# Patient Record
Sex: Female | Born: 2000 | Hispanic: Yes | Marital: Single | Smoking: Current some day smoker
Health system: Southern US, Community
[De-identification: ages and names within clinical notes are randomized; demographics above are authoritative.]

## PROBLEM LIST (undated history)

## (undated) DIAGNOSIS — R51 Headache: Secondary | ICD-10-CM

## (undated) HISTORY — DX: Headache: R51

---
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
## 2018-12-25 ENCOUNTER — Observation Stay: Payer: Managed Care, Other (non HMO) | Admitting: Anesthesiology

## 2018-12-25 ENCOUNTER — Encounter: Payer: Self-pay | Admitting: Emergency Medicine

## 2018-12-25 ENCOUNTER — Encounter
Admission: EM | Disposition: A | Discharge status: Home / Self Care | Payer: Self-pay | Source: Home / Self Care | Attending: Emergency Medicine

## 2018-12-25 ENCOUNTER — Emergency Department: Payer: Managed Care, Other (non HMO)

## 2018-12-25 ENCOUNTER — Observation Stay
Admission: EM | Admit: 2018-12-25 | Discharge: 2018-12-26 | Disposition: A | Discharge status: Home / Self Care | Payer: Managed Care, Other (non HMO) | Attending: Surgery | Admitting: Surgery

## 2018-12-25 ENCOUNTER — Observation Stay: Payer: Managed Care, Other (non HMO)

## 2018-12-25 ENCOUNTER — Other Ambulatory Visit: Payer: Self-pay

## 2018-12-25 DIAGNOSIS — F17200 Nicotine dependence, unspecified, uncomplicated: Secondary | ICD-10-CM | POA: Diagnosis not present

## 2018-12-25 DIAGNOSIS — Z793 Long term (current) use of hormonal contraceptives: Secondary | ICD-10-CM | POA: Insufficient documentation

## 2018-12-25 DIAGNOSIS — Z419 Encounter for procedure for purposes other than remedying health state, unspecified: Secondary | ICD-10-CM

## 2018-12-25 DIAGNOSIS — S82842A Displaced bimalleolar fracture of left lower leg, initial encounter for closed fracture: Secondary | ICD-10-CM | POA: Diagnosis not present

## 2018-12-25 DIAGNOSIS — S0101XA Laceration without foreign body of scalp, initial encounter: Secondary | ICD-10-CM | POA: Diagnosis not present

## 2018-12-25 DIAGNOSIS — S82892A Other fracture of left lower leg, initial encounter for closed fracture: Secondary | ICD-10-CM | POA: Diagnosis present

## 2018-12-25 DIAGNOSIS — S0003XA Contusion of scalp, initial encounter: Secondary | ICD-10-CM

## 2018-12-25 DIAGNOSIS — W010XXA Fall on same level from slipping, tripping and stumbling without subsequent striking against object, initial encounter: Secondary | ICD-10-CM | POA: Diagnosis not present

## 2018-12-25 DIAGNOSIS — M25572 Pain in left ankle and joints of left foot: Secondary | ICD-10-CM | POA: Diagnosis present

## 2018-12-25 HISTORY — PX: ORIF ANKLE FRACTURE: SHX5408

## 2018-12-25 LAB — CBC WITH DIFFERENTIAL/PLATELET
Abs Immature Granulocytes: 0.02 10*3/uL (ref 0.00–0.07)
Basophils Absolute: 0 10*3/uL (ref 0.0–0.1)
Basophils Relative: 0 %
Eosinophils Absolute: 0.1 10*3/uL (ref 0.0–0.5)
Eosinophils Relative: 1 %
HCT: 36.7 % (ref 36.0–46.0)
Hemoglobin: 12.7 g/dL (ref 12.0–15.0)
Immature Granulocytes: 0 %
Lymphocytes Relative: 19 %
Lymphs Abs: 1.6 10*3/uL (ref 0.7–4.0)
MCH: 31.3 pg (ref 26.0–34.0)
MCHC: 34.6 g/dL (ref 30.0–36.0)
MCV: 90.4 fL (ref 80.0–100.0)
Monocytes Absolute: 0.4 10*3/uL (ref 0.1–1.0)
Monocytes Relative: 5 %
Neutro Abs: 6.2 10*3/uL (ref 1.7–7.7)
Neutrophils Relative %: 75 %
Platelets: 198 10*3/uL (ref 150–400)
RBC: 4.06 MIL/uL (ref 3.87–5.11)
RDW: 11.8 % (ref 11.5–15.5)
WBC: 8.3 10*3/uL (ref 4.0–10.5)
nRBC: 0 % (ref 0.0–0.2)

## 2018-12-25 LAB — BASIC METABOLIC PANEL
Anion gap: 11 (ref 5–15)
BUN: 9 mg/dL (ref 6–20)
CO2: 22 mmol/L (ref 22–32)
Calcium: 8.7 mg/dL — ABNORMAL LOW (ref 8.9–10.3)
Chloride: 101 mmol/L (ref 98–111)
Creatinine, Ser: 0.47 mg/dL (ref 0.44–1.00)
GFR calc Af Amer: >60 mL/min (ref >60)
GFR calc non Af Amer: >60 mL/min (ref >60)
Glucose, Bld: 112 mg/dL — ABNORMAL HIGH (ref 70–99)
Potassium: 2.8 mmol/L — ABNORMAL LOW (ref 3.5–5.1)
Sodium: 134 mmol/L — ABNORMAL LOW (ref 135–145)

## 2018-12-25 LAB — URINE DRUG SCREEN, QUALITATIVE (ARMC ONLY)
Amphetamines, Ur Screen: NOT DETECTED
Barbiturates, Ur Screen: NOT DETECTED
Benzodiazepine, Ur Scrn: NOT DETECTED
Cannabinoid 50 Ng, Ur ~~LOC~~: NOT DETECTED
Cocaine Metabolite,Ur ~~LOC~~: NOT DETECTED
MDMA (Ecstasy)Ur Screen: NOT DETECTED
Methadone Scn, Ur: NOT DETECTED
Opiate, Ur Screen: POSITIVE — AB
Phencyclidine (PCP) Ur S: NOT DETECTED
Tricyclic, Ur Screen: NOT DETECTED

## 2018-12-25 LAB — ETHANOL: Alcohol, Ethyl (B): 79 mg/dL — ABNORMAL HIGH (ref <10)

## 2018-12-25 LAB — POCT I-STAT 4, (NA,K, GLUC, HGB,HCT)
Glucose, Bld: 112 mg/dL — ABNORMAL HIGH (ref 70–99)
HCT: 31 % — ABNORMAL LOW (ref 36.0–46.0)
Hemoglobin: 10.5 g/dL — ABNORMAL LOW (ref 12.0–15.0)
Potassium: 3.7 mmol/L (ref 3.5–5.1)
Sodium: 138 mmol/L (ref 135–145)

## 2018-12-25 LAB — PROTIME-INR
INR: 1 (ref 0.8–1.2)
Prothrombin Time: 13.3 seconds (ref 11.4–15.2)

## 2018-12-25 LAB — PREGNANCY, URINE: Preg Test, Ur: NEGATIVE

## 2018-12-25 SURGERY — OPEN REDUCTION INTERNAL FIXATION (ORIF) ANKLE FRACTURE
Anesthesia: General | Site: Ankle | Laterality: Left

## 2018-12-25 MED ORDER — POTASSIUM CHLORIDE IN NACL 20-0.9 MEQ/L-% IV SOLN
INTRAVENOUS | Status: DC
  Administered 2018-12-25: 16:00:00 via INTRAVENOUS
  Filled 2018-12-25 (×3): qty 1000

## 2018-12-25 MED ORDER — DOCUSATE SODIUM 100 MG PO CAPS: 100.0000 mg | ORAL_CAPSULE | Freq: Two times a day (BID) | ORAL | Status: DC

## 2018-12-25 MED ORDER — DEXAMETHASONE SODIUM PHOSPHATE 10 MG/ML IJ SOLN
INTRAMUSCULAR | Status: DC | PRN
  Administered 2018-12-25: 10 mg via INTRAVENOUS

## 2018-12-25 MED ORDER — DIPHENHYDRAMINE HCL 12.5 MG/5ML PO ELIX
12.5000 mg | ORAL_SOLUTION | ORAL | Status: DC | PRN
  Filled 2018-12-25: qty 10

## 2018-12-25 MED ORDER — ONDANSETRON HCL 4 MG/2ML IJ SOLN
INTRAMUSCULAR | Status: AC
  Filled 2018-12-25: qty 2

## 2018-12-25 MED ORDER — OXYCODONE HCL 5 MG PO TABS: 5.0000 mg | ORAL_TABLET | ORAL | Status: DC | PRN

## 2018-12-25 MED ORDER — ONDANSETRON HCL 4 MG/2ML IJ SOLN
4.0000 mg | INTRAMUSCULAR | Status: AC
  Administered 2018-12-25: 4 mg via INTRAVENOUS
  Filled 2018-12-25: qty 2

## 2018-12-25 MED ORDER — FENTANYL CITRATE (PF) 100 MCG/2ML IJ SOLN: 25.0000 ug | INTRAMUSCULAR | Status: DC | PRN

## 2018-12-25 MED ORDER — ACETAMINOPHEN 325 MG PO TABS: 325.0000 mg | ORAL_TABLET | Freq: Four times a day (QID) | ORAL | Status: DC | PRN

## 2018-12-25 MED ORDER — BISACODYL 10 MG RE SUPP: 10.0000 mg | Freq: Every day | RECTAL | Status: DC | PRN

## 2018-12-25 MED ORDER — ONDANSETRON HCL 4 MG/2ML IJ SOLN: 4.0000 mg | Freq: Four times a day (QID) | INTRAMUSCULAR | Status: DC | PRN

## 2018-12-25 MED ORDER — LIDOCAINE-EPINEPHRINE 2 %-1:100000 IJ SOLN: 30.0000 mL | Freq: Once | INTRAMUSCULAR | Status: DC

## 2018-12-25 MED ORDER — GLYCOPYRROLATE 0.2 MG/ML IJ SOLN
INTRAMUSCULAR | Status: DC | PRN
  Administered 2018-12-25: 0.2 mg via INTRAVENOUS

## 2018-12-25 MED ORDER — OXYCODONE HCL 5 MG PO TABS: 5.0000 mg | ORAL_TABLET | Freq: Four times a day (QID) | ORAL | 0 refills | Status: AC | PRN

## 2018-12-25 MED ORDER — HYDROMORPHONE HCL 1 MG/ML IJ SOLN: 0.2500 mg | INTRAMUSCULAR | Status: DC | PRN

## 2018-12-25 MED ORDER — METOCLOPRAMIDE HCL 5 MG PO TABS: 5.0000 mg | ORAL_TABLET | Freq: Three times a day (TID) | ORAL | Status: DC | PRN

## 2018-12-25 MED ORDER — MORPHINE SULFATE (PF) 2 MG/ML IV SOLN
2.0000 mg | Freq: Once | INTRAVENOUS | Status: AC
  Administered 2018-12-25: 04:00:00 2 mg via INTRAVENOUS
  Filled 2018-12-25: qty 1

## 2018-12-25 MED ORDER — FLEET ENEMA 7-19 GM/118ML RE ENEM: 1.0000 | ENEMA | Freq: Once | RECTAL | Status: DC | PRN

## 2018-12-25 MED ORDER — KETOROLAC TROMETHAMINE 15 MG/ML IJ SOLN
15.0000 mg | Freq: Four times a day (QID) | INTRAMUSCULAR | Status: DC
  Administered 2018-12-25: 15 mg via INTRAVENOUS
  Filled 2018-12-25 (×4): qty 1

## 2018-12-25 MED ORDER — 0.9 % SODIUM CHLORIDE (POUR BTL) OPTIME
TOPICAL | Status: DC | PRN
  Administered 2018-12-25: 13:00:00 1000 mL

## 2018-12-25 MED ORDER — LACTATED RINGERS IV SOLN
INTRAVENOUS | Status: DC | PRN
  Administered 2018-12-25: 12:00:00 via INTRAVENOUS

## 2018-12-25 MED ORDER — LIDOCAINE-EPINEPHRINE 2 %-1:100000 IJ SOLN
20.0000 mL | Freq: Once | INTRAMUSCULAR | Status: AC
  Administered 2018-12-25: 5 mL via INTRADERMAL
  Filled 2018-12-25: qty 1

## 2018-12-25 MED ORDER — KETOROLAC TROMETHAMINE 30 MG/ML IJ SOLN
INTRAMUSCULAR | Status: AC
  Administered 2018-12-25: 14:00:00
  Filled 2018-12-25: qty 1

## 2018-12-25 MED ORDER — MUPIROCIN 2 % EX OINT
1.0000 "application " | TOPICAL_OINTMENT | Freq: Two times a day (BID) | CUTANEOUS | Status: DC
  Administered 2018-12-25 (×2): 1 via NASAL
  Filled 2018-12-25: qty 22

## 2018-12-25 MED ORDER — CEFAZOLIN SODIUM-DEXTROSE 2-3 GM-%(50ML) IV SOLR
INTRAVENOUS | Status: DC | PRN
  Administered 2018-12-25: 2 g via INTRAVENOUS

## 2018-12-25 MED ORDER — KETOROLAC TROMETHAMINE 30 MG/ML IJ SOLN
30.0000 mg | Freq: Once | INTRAMUSCULAR | Status: AC
  Administered 2018-12-25: 14:00:00 30 mg via INTRAVENOUS

## 2018-12-25 MED ORDER — SUCCINYLCHOLINE CHLORIDE 20 MG/ML IJ SOLN
INTRAMUSCULAR | Status: AC
  Filled 2018-12-25: qty 1

## 2018-12-25 MED ORDER — ONDANSETRON HCL 4 MG/2ML IJ SOLN
INTRAMUSCULAR | Status: DC | PRN
  Administered 2018-12-25: 4 mg via INTRAVENOUS

## 2018-12-25 MED ORDER — OXYCODONE HCL 5 MG/5ML PO SOLN: 5.0000 mg | Freq: Once | ORAL | Status: DC | PRN

## 2018-12-25 MED ORDER — KCL IN DEXTROSE-NACL 20-5-0.9 MEQ/L-%-% IV SOLN
INTRAVENOUS | Status: DC
  Administered 2018-12-25: 06:00:00 via INTRAVENOUS
  Filled 2018-12-25 (×3): qty 1000

## 2018-12-25 MED ORDER — ACETAMINOPHEN 500 MG PO TABS
1000.0000 mg | ORAL_TABLET | Freq: Four times a day (QID) | ORAL | Status: DC
  Administered 2018-12-25: 18:00:00 1000 mg via ORAL
  Filled 2018-12-25: qty 2

## 2018-12-25 MED ORDER — DOCUSATE SODIUM 100 MG PO CAPS
100.0000 mg | ORAL_CAPSULE | Freq: Two times a day (BID) | ORAL | Status: DC
  Administered 2018-12-25: 22:00:00 100 mg via ORAL
  Filled 2018-12-25: qty 1

## 2018-12-25 MED ORDER — ACETAMINOPHEN 650 MG RE SUPP: 650.0000 mg | Freq: Four times a day (QID) | RECTAL | Status: DC | PRN

## 2018-12-25 MED ORDER — CEFAZOLIN SODIUM-DEXTROSE 2-4 GM/100ML-% IV SOLN
2.0000 g | Freq: Once | INTRAVENOUS | Status: DC
  Filled 2018-12-25: qty 100

## 2018-12-25 MED ORDER — ACETAMINOPHEN 325 MG PO TABS: 650.0000 mg | ORAL_TABLET | Freq: Four times a day (QID) | ORAL | Status: DC | PRN

## 2018-12-25 MED ORDER — POTASSIUM CHLORIDE CRYS ER 20 MEQ PO TBCR: 40.0000 meq | EXTENDED_RELEASE_TABLET | Freq: Once | ORAL | Status: DC

## 2018-12-25 MED ORDER — BUPIVACAINE HCL 0.5 % IJ SOLN
INTRAMUSCULAR | Status: DC | PRN
  Administered 2018-12-25: 20 mL

## 2018-12-25 MED ORDER — MIDAZOLAM HCL 2 MG/2ML IJ SOLN
INTRAMUSCULAR | Status: AC
  Filled 2018-12-25: qty 2

## 2018-12-25 MED ORDER — PANTOPRAZOLE SODIUM 40 MG IV SOLR: 40.0000 mg | Freq: Every day | INTRAVENOUS | Status: DC

## 2018-12-25 MED ORDER — CEFAZOLIN SODIUM-DEXTROSE 2-4 GM/100ML-% IV SOLN
2.0000 g | Freq: Four times a day (QID) | INTRAVENOUS | Status: AC
  Administered 2018-12-25 – 2018-12-26 (×3): 2 g via INTRAVENOUS
  Filled 2018-12-25 (×3): qty 100

## 2018-12-25 MED ORDER — ENOXAPARIN SODIUM 40 MG/0.4ML ~~LOC~~ SOLN
40.0000 mg | SUBCUTANEOUS | Status: DC
  Administered 2018-12-26: 08:00:00 40 mg via SUBCUTANEOUS
  Filled 2018-12-25: qty 0.4

## 2018-12-25 MED ORDER — LIDOCAINE HCL (CARDIAC) PF 100 MG/5ML IV SOSY
PREFILLED_SYRINGE | INTRAVENOUS | Status: DC | PRN
  Administered 2018-12-25: 60 mg via INTRAVENOUS

## 2018-12-25 MED ORDER — OXYCODONE HCL 5 MG PO TABS: 5.0000 mg | ORAL_TABLET | Freq: Once | ORAL | Status: DC | PRN

## 2018-12-25 MED ORDER — ACETAMINOPHEN 10 MG/ML IV SOLN
INTRAVENOUS | Status: AC
  Filled 2018-12-25: qty 100

## 2018-12-25 MED ORDER — MAGNESIUM HYDROXIDE 400 MG/5ML PO SUSP
30.0000 mL | Freq: Every day | ORAL | Status: DC | PRN
  Filled 2018-12-25: qty 30

## 2018-12-25 MED ORDER — ONDANSETRON HCL 4 MG PO TABS: 4.0000 mg | ORAL_TABLET | Freq: Four times a day (QID) | ORAL | Status: DC | PRN

## 2018-12-25 MED ORDER — MIDAZOLAM HCL 2 MG/2ML IJ SOLN
INTRAMUSCULAR | Status: DC | PRN
  Administered 2018-12-25: 2 mg via INTRAVENOUS

## 2018-12-25 MED ORDER — FENTANYL CITRATE (PF) 100 MCG/2ML IJ SOLN
INTRAMUSCULAR | Status: AC
  Filled 2018-12-25: qty 4

## 2018-12-25 MED ORDER — FENTANYL CITRATE (PF) 100 MCG/2ML IJ SOLN
INTRAMUSCULAR | Status: DC | PRN
  Administered 2018-12-25: 25 ug via INTRAVENOUS
  Administered 2018-12-25 (×3): 50 ug via INTRAVENOUS
  Administered 2018-12-25: 25 ug via INTRAVENOUS

## 2018-12-25 MED ORDER — TRAMADOL HCL 50 MG PO TABS
50.0000 mg | ORAL_TABLET | Freq: Four times a day (QID) | ORAL | Status: DC | PRN
  Administered 2018-12-25: 50 mg via ORAL
  Filled 2018-12-25: qty 1

## 2018-12-25 MED ORDER — DEXAMETHASONE SODIUM PHOSPHATE 4 MG/ML IJ SOLN
INTRAMUSCULAR | Status: AC
  Filled 2018-12-25: qty 1

## 2018-12-25 MED ORDER — ACETAMINOPHEN 10 MG/ML IV SOLN
INTRAVENOUS | Status: DC | PRN
  Administered 2018-12-25: 1000 mg via INTRAVENOUS

## 2018-12-25 MED ORDER — NORETHIN ACE-ETH ESTRAD-FE 1.5-30 MG-MCG PO TABS: 1.0000 | ORAL_TABLET | Freq: Every day | ORAL | Status: DC

## 2018-12-25 MED ORDER — METOCLOPRAMIDE HCL 5 MG/ML IJ SOLN: 5.0000 mg | Freq: Three times a day (TID) | INTRAMUSCULAR | Status: DC | PRN

## 2018-12-25 MED ORDER — LIDOCAINE HCL (PF) 1 % IJ SOLN: 5.0000 mL | Freq: Once | INTRAMUSCULAR | Status: DC

## 2018-12-25 MED ORDER — GLYCOPYRROLATE 0.2 MG/ML IJ SOLN
INTRAMUSCULAR | Status: AC
  Filled 2018-12-25: qty 1

## 2018-12-25 MED ORDER — LIDOCAINE HCL (PF) 2 % IJ SOLN
INTRAMUSCULAR | Status: AC
  Filled 2018-12-25: qty 10

## 2018-12-25 MED ORDER — PROPOFOL 10 MG/ML IV BOLUS
INTRAVENOUS | Status: DC | PRN
  Administered 2018-12-25: 150 mg via INTRAVENOUS

## 2018-12-25 MED ORDER — PROPOFOL 10 MG/ML IV BOLUS
INTRAVENOUS | Status: AC
  Filled 2018-12-25: qty 20

## 2018-12-25 MED ORDER — SEVOFLURANE IN SOLN
RESPIRATORY_TRACT | Status: AC
  Filled 2018-12-25: qty 250

## 2018-12-25 MED ORDER — BUPIVACAINE HCL (PF) 0.5 % IJ SOLN
INTRAMUSCULAR | Status: AC
  Filled 2018-12-25: qty 30

## 2018-12-25 SURGICAL SUPPLY — 64 items
BANDAGE ACE 4X5 VEL STRL LF (GAUZE/BANDAGES/DRESSINGS) ×3 IMPLANT
BANDAGE ACE 6X5 VEL STRL LF (GAUZE/BANDAGES/DRESSINGS) ×3 IMPLANT
BIT DRILL 2.5X2.75 QC CALB (BIT) ×3 IMPLANT
BIT DRILL 2.9 CANN QC NONSTRL (BIT) ×3 IMPLANT
BIT DRILL CALIBRATED 2.7 (BIT) ×2 IMPLANT
BIT DRILL CALIBRATED 2.7MM (BIT) ×1
BLADE SURG 10 STRL SS SAFETY (BLADE) ×3 IMPLANT
BLADE SURG SZ10 CARB STEEL (BLADE) ×6 IMPLANT
BNDG COHESIVE 4X5 TAN STRL (GAUZE/BANDAGES/DRESSINGS) ×3 IMPLANT
BNDG ESMARK 6X12 TAN STRL LF (GAUZE/BANDAGES/DRESSINGS) ×3 IMPLANT
BNDG PLASTER FAST 4X5 WHT LF (CAST SUPPLIES) ×12 IMPLANT
CANISTER SUCT 1200ML W/VALVE (MISCELLANEOUS) ×3 IMPLANT
CHLORAPREP W/TINT 26 (MISCELLANEOUS) ×6 IMPLANT
COVER WAND RF STERILE (DRAPES) ×3 IMPLANT
CUFF TOURN SGL QUICK 24 (TOURNIQUET CUFF)
CUFF TOURN SGL QUICK 30 (TOURNIQUET CUFF)
CUFF TRNQT CYL 24X4X16.5-23 (TOURNIQUET CUFF) IMPLANT
CUFF TRNQT CYL 30X4X21-28X (TOURNIQUET CUFF) IMPLANT
DRAPE C-ARM XRAY 36X54 (DRAPES) ×3 IMPLANT
DRAPE C-ARMOR (DRAPES) ×3 IMPLANT
DRAPE INCISE IOBAN 66X45 STRL (DRAPES) ×3 IMPLANT
DRAPE U-SHAPE 47X51 STRL (DRAPES) ×3 IMPLANT
ELECT CAUTERY BLADE 6.4 (BLADE) ×3 IMPLANT
ELECT REM PT RETURN 9FT ADLT (ELECTROSURGICAL) ×3
ELECTRODE REM PT RTRN 9FT ADLT (ELECTROSURGICAL) ×1 IMPLANT
GAUZE SPONGE 4X4 12PLY STRL (GAUZE/BANDAGES/DRESSINGS) ×3 IMPLANT
GAUZE XEROFORM 1X8 LF (GAUZE/BANDAGES/DRESSINGS) ×3 IMPLANT
GLOVE BIO SURGEON STRL SZ8 (GLOVE) ×6 IMPLANT
GLOVE INDICATOR 8.0 STRL GRN (GLOVE) ×3 IMPLANT
GOWN STRL REUS W/ TWL LRG LVL3 (GOWN DISPOSABLE) ×1 IMPLANT
GOWN STRL REUS W/ TWL XL LVL3 (GOWN DISPOSABLE) ×1 IMPLANT
GOWN STRL REUS W/TWL LRG LVL3 (GOWN DISPOSABLE) ×2
GOWN STRL REUS W/TWL XL LVL3 (GOWN DISPOSABLE) ×2
HEMOVAC 400ML (MISCELLANEOUS)
K-WIRE ACE 1.6X6 (WIRE) ×9
KIT DRAIN HEMOVAC JP 7FR 400ML (MISCELLANEOUS) IMPLANT
KIT TURNOVER KIT A (KITS) ×3 IMPLANT
KWIRE ACE 1.6X6 (WIRE) ×3 IMPLANT
LABEL OR SOLS (LABEL) ×3 IMPLANT
NS IRRIG 1000ML POUR BTL (IV SOLUTION) ×3 IMPLANT
PACK EXTREMITY ARMC (MISCELLANEOUS) ×3 IMPLANT
PAD ABD DERMACEA PRESS 5X9 (GAUZE/BANDAGES/DRESSINGS) ×3 IMPLANT
PAD CAST CTTN 4X4 STRL (SOFTGOODS) ×1 IMPLANT
PAD PREP 24X41 OB/GYN DISP (PERSONAL CARE ITEMS) ×3 IMPLANT
PADDING CAST COTTON 4X4 STRL (SOFTGOODS) ×2
PLATE LOCK 7H 92 BILAT FIB (Plate) ×3 IMPLANT
SCREW ACE CAN 4.0 42M (Screw) ×3 IMPLANT
SCREW ACE CAN 4.0 50M (Screw) ×3 IMPLANT
SCREW LOCK CORT STAR 3.5X12 (Screw) ×6 IMPLANT
SCREW LOCK CORT STAR 3.5X14 (Screw) ×3 IMPLANT
SCREW LOW PROFILE 12MMX3.5MM (Screw) ×9 IMPLANT
SPLINT CAST 1 STEP 4X30 (MISCELLANEOUS) ×3 IMPLANT
SPONGE LAP 18X18 RF (DISPOSABLE) ×3 IMPLANT
STAPLER SKIN PROX 35W (STAPLE) ×3 IMPLANT
STOCKINETTE IMPERV 14X48 (MISCELLANEOUS) ×3 IMPLANT
STOCKINETTE M/LG 89821 (MISCELLANEOUS) ×3 IMPLANT
SUT VIC AB 0 CT1 36 (SUTURE) ×3 IMPLANT
SUT VIC AB 2-0 CT1 27 (SUTURE) ×6
SUT VIC AB 2-0 CT1 TAPERPNT 27 (SUTURE) ×3 IMPLANT
SUT VIC AB 2-0 SH 27 (SUTURE)
SUT VIC AB 2-0 SH 27XBRD (SUTURE) IMPLANT
SUT VIC AB 3-0 SH 27 (SUTURE) ×2
SUT VIC AB 3-0 SH 27X BRD (SUTURE) ×1 IMPLANT
SYR 10ML LL (SYRINGE) ×3 IMPLANT

## 2018-12-25 NOTE — ED Triage Notes (Signed)
Pt arrives from home following a fall; pt says she "genuinely can't remember" the fall; pt says she was in her living room but cant even say why she got up; obvious deformity to left ankle; laceration to the back of her head; c/o pain to both of those areas only; pt admits to 2 drinks tonight

## 2018-12-25 NOTE — ED Notes (Signed)
Left foot warm to touch; pulses palpable; able to wiggle toes and sensation to whole foot; laceration to occiput; bleeding controlled; pt tearful at times, says she's been emotional lately; reports "probably" some depression but does not desire to speak with some today-"definitely not"; denies loss of consciousness after fall;

## 2018-12-25 NOTE — Anesthesia Procedure Notes (Signed)
Procedure Name: LMA Insertion Date/Time: 12/25/2018 12:09 PM Performed by: Stormy Fabian, CRNA Pre-anesthesia Checklist: Patient identified, Patient being monitored, Timeout performed, Emergency Drugs available and Suction available Patient Re-evaluated:Patient Re-evaluated prior to induction Oxygen Delivery Method: Circle system utilized Preoxygenation: Pre-oxygenation with 100% oxygen Induction Type: IV induction Ventilation: Mask ventilation without difficulty LMA: LMA inserted LMA Size: 3.5 Tube type: Oral Number of attempts: 1 Placement Confirmation: positive ETCO2 and breath sounds checked- equal and bilateral Tube secured with: Tape Dental Injury: Teeth and Oropharynx as per pre-operative assessment

## 2018-12-25 NOTE — ED Notes (Signed)
Dr Forbach in to see pt 

## 2018-12-25 NOTE — ED Notes (Signed)
ED TO INPATIENT HANDOFF REPORT  ED Nurse Name and Phone #: Barnabas Harries, RN [phone]  S Name/Age/Gender Diane Mayer 18 y.o. female Room/Bed: ED02A/ED02A  Code Status   Code Status: Not on file  Home/SNF/Other Home Patient oriented to: self, place, time and situation Is this baseline? Yes   Triage Complete: Triage complete  Chief Complaint Left foot injury  Triage Note Pt arrives from home following a fall; pt says she "genuinely can't remember" the fall; pt says she was in her living room but cant even say why she got up; obvious deformity to left ankle; laceration to the back of her head; c/o pain to both of those areas only; pt admits to 2 drinks tonight   Allergies No Known Allergies  Level of Care/Admitting Diagnosis ED Disposition    ED Disposition Condition Comment   Admit  Hospital Area: [facility] [[id number]]  Level of Care: Med-Surg [16]  Covid Evaluation: N/A  Diagnosis: Ankle fracture, left [660630]  Admitting Physician: Christena Flake [[id number]]  Attending Physician: Christena Flake [phone]  PT Class (Do Not Modify): Observation [104]  PT Acc Code (Do Not Modify): Observation [10022]       B Medical/Surgery History Past Medical History:  Diagnosis Date  . Headache(784.0)    History reviewed. No pertinent surgical history.   A IV Location/Drains/Wounds Patient Lines/Drains/Airways Status   Active Line/Drains/Airways    Name:   Placement date:   Placement time:   Site:   Days:   Peripheral IV 12/25/18 Left Antecubital   12/25/18    0245    Antecubital   less than 1          Intake/Output Last 24 hours No intake or output data in the 24 hours ending 12/25/18 0355  Labs/Imaging Results for orders placed or performed during the hospital encounter of 12/25/18 (from the past 48 hour(s))  Basic metabolic panel     Status: Abnormal   Collection Time: 12/25/18  2:50 AM  Result Value Ref Range   Sodium 134 (L) 135 -  145 mmol/L   Potassium 2.8 (L) 3.5 - 5.1 mmol/L   Chloride 101 98 - 111 mmol/L   CO2 22 22 - 32 mmol/L   Glucose, Bld 112 (H) 70 - 99 mg/dL   BUN 9 6 - 20 mg/dL   Creatinine, Ser 2.02 0.44 - 1.00 mg/dL   Calcium 8.7 (L) 8.9 - 10.3 mg/dL   GFR calc non Af Amer >60 >60 mL/min   GFR calc Af Amer >60 >60 mL/min   Anion gap 11 5 - 15    Comment: Performed at [facility], [address] [place], [place] [address]  CBC with Differential/Platelet     Status: None   Collection Time: 12/25/18  2:50 AM  Result Value Ref Range   WBC 8.3 4.0 - 10.5 K/uL   RBC 4.06 3.87 - 5.11 MIL/uL   Hemoglobin 12.7 12.0 - 15.0 g/dL   HCT 62.3 76.2 - 83.1 %   MCV 90.4 80.0 - 100.0 fL   MCH 31.3 26.0 - 34.0 pg   MCHC 34.6 30.0 - 36.0 g/dL   RDW 51.7 61.6 - 07.3 %   Platelets 198 150 - 400 K/uL   nRBC 0.0 0.0 - 0.2 %   Neutrophils Relative % 75 %   Neutro Abs 6.2 1.7 - 7.7 K/uL   Lymphocytes Relative 19 %   Lymphs Abs 1.6 0.7 - 4.0 K/uL   Monocytes Relative  5 %   Monocytes Absolute 0.4 0.1 - 1.0 K/uL   Eosinophils Relative 1 %   Eosinophils Absolute 0.1 0.0 - 0.5 K/uL   Basophils Relative 0 %   Basophils Absolute 0.0 0.0 - 0.1 K/uL   Immature Granulocytes 0 %   Abs Immature Granulocytes 0.02 0.00 - 0.07 K/uL    Comment: Performed at [facility], [address] [place], [place] [address]  Protime-INR     Status: None   Collection Time: 12/25/18  2:50 AM  Result Value Ref Range   Prothrombin Time 13.3 11.4 - 15.2 seconds   INR 1.0 0.8 - 1.2    Comment: (NOTE) INR goal varies based on device and disease states. Performed at [facility], [address] [place], [place] [address]   Ethanol     Status: Abnormal   Collection Time: 12/25/18  2:50 AM  Result Value Ref Range   Alcohol, Ethyl (B) 79 (H) <10 mg/dL    Comment: (NOTE) Lowest detectable limit for serum alcohol is 10 mg/dL. For medical purposes only. Performed at [facility], [address] [place], [place] [address]    Dg Ankle Complete Left  Result Date: 12/25/2018 CLINICAL DATA:  18 year old female status post fall with ankle deformity. EXAM: LEFT ANKLE COMPLETE - 3+ VIEW COMPARISON:  None. FINDINGS: Comminuted fracture of the distal left fibula metadiaphysis with lateral displacement. Mild associated anterior and lateral subluxation of the mortise joint. Transverse mildly comminuted fracture of the medial malleolus with mild anterior and lateral displacement. No definite posterior malleolus fracture. Talar dome and calcaneus intact. Visible bones of the left foot intact. Medial and anterior more so than lateral soft tissue swelling. IMPRESSION: Comminuted fractures of the distal fibula metadiaphysis and medial malleolus with mild anterior and lateral subluxation of the mortise joint. Electronically Signed   By: Odessa FlemingH  Hall M.D.   On: 12/25/2018 01:53    Pending Labs Unresulted Labs (From admission, onward)    Start     Ordered   12/25/18 0216  Urine Drug Screen, Qualitative ([facility] only)  ONCE - STAT,   STAT     12/25/18 0216          Vitals/Pain Today's Vitals   12/25/18 0230 12/25/18 0247 12/25/18 0300 12/25/18 0330  BP: 112/72  104/73 (!) 99/54  Pulse: 88  83 82  Resp:      Temp:      TempSrc:      SpO2: 100%  100% 100%  Weight:      Height:      PainSc:  4       Isolation Precautions No active isolations  Medications Medications  lidocaine-EPINEPHrine (XYLOCAINE W/EPI) 2 %-1:100000 (with pres) injection 30 mL (has no administration in time range)  ceFAZolin (ANCEF) IVPB 2g/100 mL premix (has no administration in time range)  morphine 2 MG/ML injection 2 mg (has no administration in time range)  ondansetron (ZOFRAN) injection 4 mg (has no administration in time range)  lidocaine-EPINEPHrine (XYLOCAINE W/EPI) 2 %-1:100000 (with pres) injection 20 mL (5 mLs Intradermal Given 12/25/18 0315)    Mobility walks Low fall risk   Focused  Assessments musculoskeletal   R Recommendations: See Admitting Provider Note  Report given to: Corrie DandyMary, RN  Additional Notes:  Pt will be given Morphine 2mg  and Zofran 4mg  prior to transport
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
## 2018-12-25 NOTE — ED Notes (Signed)
Assisted Dr York Cerise with cleaning hair well and examining the back of pt's head; 8 staples placed by MD; pt tolerated well with this RN holding her hand and offering words of comfort;

## 2018-12-25 NOTE — ED Notes (Signed)
Pain down to 4/10 after splint applied

## 2018-12-25 NOTE — Anesthesia Post-op Follow-up Note (Signed)
Anesthesia QCDR form completed.        

## 2018-12-25 NOTE — ED Notes (Signed)
Ice applied to left ankle.

## 2018-12-25 NOTE — Anesthesia Preprocedure Evaluation (Signed)
Anesthesia Evaluation  Patient identified by MRN, date of birth, ID band Patient awake    Reviewed: Allergy & Precautions, H&P , NPO status , Patient's Chart, lab work & pertinent test results  History of Anesthesia Complications Negative for: history of anesthetic complications  Airway Mallampati: II  TM Distance: >3 FB Neck ROM: full    Dental  (+) Chipped   Pulmonary neg shortness of breath, Current Smoker,           Cardiovascular Exercise Tolerance: Good (-) angina(-) Past MI and (-) DOE negative cardio ROS       Neuro/Psych  Headaches, negative psych ROS   GI/Hepatic negative GI ROS, Neg liver ROS,   Endo/Other  negative endocrine ROS  Renal/GU      Musculoskeletal   Abdominal   Peds  Hematology negative hematology ROS (+)   Anesthesia Other Findings Past Medical History: No date: Headache(784.0)  History reviewed. No pertinent surgical history.  BMI    Body Mass Index:  21.46 kg/m      Reproductive/Obstetrics negative OB ROS                             Anesthesia Physical Anesthesia Plan  ASA: II  Anesthesia Plan: General LMA   Post-op Pain Management:    Induction: Intravenous  PONV Risk Score and Plan: Dexamethasone, Ondansetron, Midazolam and Treatment may vary due to age or medical condition  Airway Management Planned: LMA  Additional Equipment:   Intra-op Plan:   Post-operative Plan: Extubation in OR  Informed Consent: I have reviewed the patients History and Physical, chart, labs and discussed the procedure including the risks, benefits and alternatives for the proposed anesthesia with the patient or authorized representative who has indicated his/her understanding and acceptance.     Dental Advisory Given  Plan Discussed with: Anesthesiologist, CRNA and Surgeon  Anesthesia Plan Comments: (Patient consented for risks of anesthesia including but  not limited to:  - adverse reactions to medications - damage to teeth, lips or other oral mucosa - sore throat or hoarseness - Damage to heart, brain, lungs or loss of life  Patient voiced understanding.)        Anesthesia Quick Evaluation

## 2018-12-25 NOTE — ED Notes (Signed)
The rest of pt's hair cleaned as well as possible; dry gown, clean sheets and warm blanket for comfort; pt appreciative;
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
## 2018-12-25 NOTE — Progress Notes (Signed)
Pt admitted to room 113. Pt resting comfortably in bed. Staples to back of head intact. Left ankle resting on pillows. Pt has ice applied to ankle. No complaints of pain at this time.

## 2018-12-25 NOTE — Transfer of Care (Signed)
Immediate Anesthesia Transfer of Care Note  Patient: Diane Mayer  Procedure(s) Performed: Procedure(s): OPEN REDUCTION INTERNAL FIXATION (ORIF) LEFT ANKLE FRACTURE (Left)  Patient Location: PACU  Anesthesia Type:General  Level of Consciousness: sedated  Airway & Oxygen Therapy: Patient Spontanous Breathing and Patient connected to face mask oxygen  Post-op Assessment: Report given to RN and Post -op Vital signs reviewed and stable  Post vital signs: Reviewed and stable  Last Vitals:  Vitals:   12/25/18 1122 12/25/18 1410  BP: (!) 112/59 102/70  Pulse: 100 81  Resp: 18 14  Temp:  37.4 C  SpO2: 100% 100%    Complications: No apparent anesthesia complications

## 2018-12-26 ENCOUNTER — Encounter: Payer: Self-pay | Admitting: Surgery

## 2018-12-26 NOTE — Anesthesia Postprocedure Evaluation (Signed)
Anesthesia Post Note  Patient: Diane Mayer  Procedure(s) Performed: OPEN REDUCTION INTERNAL FIXATION (ORIF) LEFT ANKLE FRACTURE (Left Ankle)  Patient location during evaluation: PACU Anesthesia Type: General Level of consciousness: awake and alert and oriented Pain management: pain level controlled Vital Signs Assessment: post-procedure vital signs reviewed and stable Respiratory status: spontaneous breathing Cardiovascular status: blood pressure returned to baseline Anesthetic complications: no     Last Vitals:  Vitals:   12/26/18 0347 12/26/18 0757  BP: 98/71 102/62  Pulse: 63 66  Resp: 20 16  Temp: 36.8 C 36.8 C  SpO2: 100% 100%    Last Pain:  Vitals:   12/26/18 0757  TempSrc: Oral  PainSc:                  Timmy Bubeck

## 2018-12-26 NOTE — TOC Transition Note (Addendum)
Transition of Care [facility]) - CM/SW Discharge Note   Patient Details  Name: Diane Mayer MRN: [id number] Date of Birth: [birth year]  [facility]) CM/SW Contact:  Elliot Gault, LCSW Phone Number: 12/26/2018, 10:51 AM   Clinical Narrative:     PT indicating need for crutches at dc. Referred to Brad with [employer] at pt request. Spoke with pt briefly prior to dc to assess TOC needs. Pt inquiring about return to work note. Pt reports that she is going to stay with her parents until she is more functional. ED notes indicated pt reported feeling depressed the night this injury occurred. Discussed with pt options for counseling if she felt she needed it. Pt reports that she was feeling stressed with her college classes and also work. She states that she feels that she is ok and that she does have supportive people in her life that she can talk to. Pt denies any suicidal thoughts or intent. Written information on outpt mental health/AODA resources provided to pt. There are no other TOC needs for dc.  Contacted PA Micah Noel to inquire about return to work note. He agreed to complete but pt left before note completion/delivery. Updated PA. Pt can contact the office or obtain note at her follow up appointment.  Final next level of care: Home/Self Care Barriers to Discharge: No Barriers Identified   Patient Goals and CMS Choice   CMS Medicare.gov Compare Post Acute Care list provided to:: Patient Choice offered to / list presented to : Patient  Discharge Placement              Discharge Plan and Services                DME Arranged: Crutches DME Agency: [employer] Date DME Agency Contacted: 12/26/18 Time DME Agency Contacted: 1000 Representative spoke with at DME Agency: Nida Boatman            Social Determinants of Health (SDOH) Interventions     Readmission Risk Interventions No flowsheet data found.

## 2018-12-26 NOTE — Discharge Summary (Signed)
Physician Discharge Summary  Patient ID: Diane Mayer MRN: [id number] DOB/AGE: 2000-11-07 18 y.o.  Admit date: 12/25/2018 Discharge date: 12/26/2018  Admission Diagnoses:  Contusion of occipital region of scalp, initial encounter [S00.03XA] Laceration of scalp, initial encounter [S01.01XA] Bimalleolar fracture of left ankle, closed, initial encounter [S82.842A] Ankle fracture, left [S82.892A]  Discharge Diagnoses: Patient Active Problem List   Diagnosis Date Noted  . Ankle fracture, left 12/25/2018  . Episodic tension type headache 02/07/2014  . Migraine without aura, without mention of intractable migraine without mention of status migrainosus 02/07/2014    Past Medical History:  Diagnosis Date  . Headache(784.0)    Transfusion: None.   Consultants (if any):   Discharged Condition: Improved  Hospital Course: Serrita Susa RaringRobles-Melendez is an 18 y.o. female who was admitted 12/25/2018 with a diagnosis of a displaced bimalleolar fracture of the left ankle and went to the operating room on 12/25/2018 and underwent the below named procedures.    Surgeries: Procedure(s): OPEN REDUCTION INTERNAL FIXATION (ORIF) LEFT ANKLE FRACTURE on 12/25/2018 Patient tolerated the surgery well. Taken to PACU where she was stabilized and then transferred to the orthopedic floor.  Started on Lovenox 40mg  q 24 hrs. Foot pumps applied bilaterally at 80 mm. Heels elevated on bed with rolled towels. No evidence of DVT. Negative Homan. Physical therapy started on day #1 for gait training and transfer. OT started day #1 for ADL and assisted devices.  Patient's IV was d/c on POD1.  Implants: Biomet ALPS 7-hole composite locking plate and screws  She was given perioperative antibiotics:  Anti-infectives (From admission, onward)   Start     Dose/Rate Route Frequency Ordered Stop   12/25/18 1530  ceFAZolin (ANCEF) IVPB 2g/100 mL premix  Status:  Discontinued     2 g 200 mL/hr over 30 Minutes  Intravenous  Once 12/25/18 0220 12/25/18 0934   12/25/18 1515  ceFAZolin (ANCEF) IVPB 2g/100 mL premix     2 g 200 mL/hr over 30 Minutes Intravenous Every 6 hours 12/25/18 1503 12/26/18 0416    .  She was given sequential compression devices, early ambulation, and Lovenox for DVT prophylaxis.  She benefited maximally from the hospital stay and there were no complications.    Recent vital signs:  Vitals:   12/26/18 0030 12/26/18 0347  BP: 101/63 98/71  Pulse: 81 63  Resp: 17 20  Temp: 98.1 F (36.7 C) 98.3 F (36.8 C)  SpO2: 99% 100%    Recent laboratory studies:  Lab Results  Component Value Date   HGB 10.5 (L) 12/25/2018   HGB 12.7 12/25/2018   HGB 13.0 02/15/2012   Lab Results  Component Value Date   WBC 8.3 12/25/2018   PLT 198 12/25/2018   Lab Results  Component Value Date   INR 1.0 12/25/2018   Lab Results  Component Value Date   NA 138 12/25/2018   K 3.7 12/25/2018   CL 101 12/25/2018   CO2 22 12/25/2018   BUN 9 12/25/2018   CREATININE 0.47 12/25/2018   GLUCOSE 112 (H) 12/25/2018    Discharge Medications:   Allergies as of 12/26/2018   No Known Allergies     Medication List    TAKE these medications   norethindrone-ethinyl estradiol-iron 1.5-30 MG-MCG tablet Commonly known as:  LOESTRIN FE Take 1 tablet by mouth daily.   oxyCODONE 5 MG immediate release tablet Commonly known as:  Roxicodone Take 1-2 tablets (5-10 mg total) by mouth every 6 (six) hours as needed for moderate pain.  Diagnostic Studies: Dg Ankle Complete Left  Result Date: 12/25/2018 CLINICAL DATA:  Status post ankle fracture repair EXAM: DG C-ARM 61-120 MIN; LEFT ANKLE COMPLETE - 3+ VIEW COMPARISON:  None FLUOROSCOPY TIME:  21 seconds FINDINGS: Distal fibular metaphysis fracture transfixed with a lateral sideplate and multiple interlocking screws in near anatomic alignment. Medial malleolar fracture transfixed with 2 cannulated transmalleolar screws in anatomic alignment.  No other fracture or dislocation. IMPRESSION: Interval ORIF of medial and lateral malleolar fractures. Electronically Signed   By: Elige Ko   On: 12/25/2018 13:47   Dg Ankle Complete Left  Result Date: 12/25/2018 CLINICAL DATA:  18 year old female status post fall with ankle deformity. EXAM: LEFT ANKLE COMPLETE - 3+ VIEW COMPARISON:  None. FINDINGS: Comminuted fracture of the distal left fibula metadiaphysis with lateral displacement. Mild associated anterior and lateral subluxation of the mortise joint. Transverse mildly comminuted fracture of the medial malleolus with mild anterior and lateral displacement. No definite posterior malleolus fracture. Talar dome and calcaneus intact. Visible bones of the left foot intact. Medial and anterior more so than lateral soft tissue swelling. IMPRESSION: Comminuted fractures of the distal fibula metadiaphysis and medial malleolus with mild anterior and lateral subluxation of the mortise joint. Electronically Signed   By: Odessa Fleming M.D.   On: 12/25/2018 01:53   Dg C-arm 1-60 Min  Result Date: 12/25/2018 CLINICAL DATA:  Status post ankle fracture repair EXAM: DG C-ARM 61-120 MIN; LEFT ANKLE COMPLETE - 3+ VIEW COMPARISON:  None FLUOROSCOPY TIME:  21 seconds FINDINGS: Distal fibular metaphysis fracture transfixed with a lateral sideplate and multiple interlocking screws in near anatomic alignment. Medial malleolar fracture transfixed with 2 cannulated transmalleolar screws in anatomic alignment. No other fracture or dislocation. IMPRESSION: Interval ORIF of medial and lateral malleolar fractures. Electronically Signed   By: Elige Ko   On: 12/25/2018 13:47   Disposition: Plan for discharge home on 12/26/18.  Follow-up Information    Anson Oregon, PA-C. Call in 14 day(s).   Specialty:  Physician Assistant Why:  Call tomorrow to schedule appointment in 10-14 days. Contact information: [address] [place]  [address] [phone]          Signed: Meriel Pica PA-C 12/26/2018, 7:44 AM

## 2018-12-26 NOTE — Evaluation (Signed)
Physical Therapy Evaluation Patient Details Name: Diane FerrariHerminia Mayer MRN: [id number] DOB: [birth year] Today's Date: 12/26/2018   History of Present Illness  18 y/o female here with L ankle fx, ORIF.    Clinical Impression  Pt did relatively well with ambulation using crutches, initially some unsteadiness, but with getting crutches to more appropriate height (chart says 5'4", 5'7" crutch setting was most appropriate).  She was able to negotiate up/down steps and circumambulate the nurses' station with good overall tolerance.  She showed good ability to maintain NWBing during transitions and actual mobility.  Pt would benefit from knee scooter, and this was discussed with pt (does not appear that this equipment would be covered by insurance).  Pt safe for discharge from PT stand-point.     Follow Up Recommendations Follow surgeon's recommendation for DC plan and follow-up therapies    Equipment Recommendations  Crutches(states she can figure out a shower bench/seat)    Recommendations for Other Services       Precautions / Restrictions Precautions Precautions: Fall Restrictions Weight Bearing Restrictions: Yes LLE Weight Bearing: Non weight bearing      Mobility  Bed Mobility Overal bed mobility: Independent             General bed mobility comments: Pt easily gets to sitting EOB w/o assist  Transfers Overall transfer level: Modified independent Equipment used: Crutches             General transfer comment: Cuing for appropriate AD management and insuring UEs assist with L NWBing  Ambulation/Gait Ambulation/Gait assistance: Modified independent (Device/Increase time) Gait Distance (Feet): 250 Feet Assistive device: Crutches       General Gait Details: Pt safe with prolonged ambulation with crutches, had a few small LOBs that she could self arrest, after setting appropriate crutch height pt with more confidence and safety  Stairs Stairs: Yes Stairs  assistance: Supervision Stair Management: One rail Right;With crutches Number of Stairs: 5 General stair comments: Pt able to negotiate up/down steps with 2 crutchs in 1 hand and other on rail, pt able to negotiate w/o direct assist  Wheelchair Mobility    Modified Rankin (Stroke Patients Only)       Balance Overall balance assessment: Modified Independent                                           Pertinent Vitals/Pain Pain Assessment: No/denies pain(reports just minimal soreness in L ankle)    Home Living Family/patient expects to be discharged to:: Private residence Living Arrangements: Other (Comment)(roommates) Available Help at Discharge: Friend(s)   Home Access: Stairs to enter Entrance Stairs-Rails: Can reach both Entrance Stairs-Number of Steps: 4 Home Layout: One level Home Equipment: None      Prior Function Level of Independence: Independent         Comments: Pt works, drives, is normally active     Higher education careers adviserHand Dominance        Extremity/Trunk Assessment   Upper Extremity Assessment Upper Extremity Assessment: Overall WFL for tasks assessed    Lower Extremity Assessment Lower Extremity Assessment: Overall WFL for tasks assessed(except L ankle in splint, very little L toe AROM)       Communication   Communication: No difficulties  Cognition Arousal/Alertness: Awake/alert Behavior During Therapy: WFL for tasks assessed/performed Overall Cognitive Status: Within Functional Limits for tasks assessed  General Comments      Exercises     Assessment/Plan    PT Assessment Patient needs continued PT services  PT Problem List Decreased strength;Decreased range of motion;Decreased activity tolerance;Decreased balance;Decreased mobility;Decreased knowledge of use of DME;Decreased safety awareness;Decreased knowledge of precautions;Pain;Cardiopulmonary status limiting activity        PT Treatment Interventions DME instruction;Gait training;Stair training;Functional mobility training;Therapeutic activities;Therapeutic exercise;Balance training;Patient/family education    PT Goals (Current goals can be found in the Care Plan section)  Acute Rehab PT Goals 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, typically standing most of the day" PT Goal Formulation: With patient Time For Goal Achievement: 01/09/19 Potential to Achieve Goals: Good    Frequency 7X/week   Barriers to discharge        Co-evaluation               AM-PAC PT "6 Clicks" Mobility  Outcome Measure Help needed turning from your back to your side while in a flat bed without using bedrails?: None Help needed moving from lying on your back to sitting on the side of a flat bed without using bedrails?: None Help needed moving to and from a bed to a chair (including a wheelchair)?: None Help needed standing up from a chair using your arms (e.g., wheelchair or bedside chair)?: None Help needed to walk in hospital room?: A Little Help needed climbing 3-5 steps with a railing? : A Little 6 Click Score: 22    End of Session Equipment Utilized During Treatment: Gait belt Activity Tolerance: Patient tolerated treatment well Patient left: with call bell/phone within reach;with chair alarm set Nurse Communication: Mobility status PT Visit Diagnosis: Difficulty in walking, not elsewhere classified (R26.2);History of falling (Z91.81);Pain Pain - Right/Left: Left Pain - part of body: Ankle and joints of foot    Time: 6945-0388 PT Time Calculation (min) (ACUTE ONLY): 34 min   Charges:   PT Evaluation $PT Eval Low Complexity: 1 Low PT Treatments $Gait Training: 8-22 mins        Malachi Pro, DPT 12/26/2018, 11:09 AM

## 2018-12-26 NOTE — Progress Notes (Signed)
  Subjective: 1 Day Post-Op Procedure(s) (LRB): OPEN REDUCTION INTERNAL FIXATION (ORIF) LEFT ANKLE FRACTURE (Left) Patient reports pain as mild.   Patient is well, and has had no acute complaints or problems Plan is to go Home after hospital stay. Negative for chest pain and shortness of breath Fever: no Gastrointestinal:Negative for nausea and vomiting  Objective: Vital signs in last 24 hours: Temp:  [98.1 F (36.7 C)-99.4 F (37.4 C)] 98.3 F (36.8 C) (04/28 0347) Pulse Rate:  [61-100] 63 (04/28 0347) Resp:  [13-20] 20 (04/28 0347) BP: (98-115)/(59-81) 98/71 (04/28 0347) SpO2:  [99 %-100 %] 100 % (04/28 0347) Weight:  [56.7 kg] 56.7 kg (04/27 1122)  Intake/Output from previous day:  Intake/Output Summary (Last 24 hours) at 12/26/2018 0740 Last data filed at 12/26/2018 0346 Gross per 24 hour  Intake 1816.84 ml  Output 10 ml  Net 1806.84 ml    Intake/Output this shift: No intake/output data recorded.  Labs: Recent Labs    12/25/18 0250 12/25/18 1127  HGB 12.7 10.5*   Recent Labs    12/25/18 0250 12/25/18 1127  WBC 8.3  --   RBC 4.06  --   HCT 36.7 31.0*  PLT 198  --    Recent Labs    12/25/18 0250 12/25/18 1127  NA 134* 138  K 2.8* 3.7  CL 101  --   CO2 22  --   BUN 9  --   CREATININE 0.47  --   GLUCOSE 112* 112*  CALCIUM 8.7*  --    Recent Labs    12/25/18 0250  INR 1.0     EXAM General - Patient is Alert, Appropriate and Oriented Extremity - ABD soft Sensation intact distally Incision: dressing C/D/I No cellulitis present  Intact to light touch to the dorsal and volar aspect of her toes. Dressing/Incision - clean, dry, no drainage present to the short leg splint. Motor Function - intact, moving toes well on exam.  Abd soft with normal BS this AM.  Past Medical History:  Diagnosis Date  . Headache(784.0)     Assessment/Plan: 1 Day Post-Op Procedure(s) (LRB): OPEN REDUCTION INTERNAL FIXATION (ORIF) LEFT ANKLE FRACTURE  (Left) Active Problems:   Ankle fracture, left  Estimated body mass index is 21.46 kg/m as calculated from the following:   Height as of this encounter: 5\' 4"  (1.626 m).   Weight as of this encounter: 56.7 kg. Advance diet Up with therapy D/C IV fluids when tolerating po intake.  Pt denies any significant pain. Up with PT this morning. Plan for discharge home this afternoon.  DVT Prophylaxis - Lovenox and TED hose Non-weightbearing to the left leg.  Valeria Batman, PA-C [facility] 12/26/2018, 7:40 AM

## 2018-12-26 NOTE — Progress Notes (Signed)
Discharge instructions reviewed with patient. IV removed. Crutches have been given to her. Belongings packed up. She is now waiting on family to pick her up.

## 2018-12-26 NOTE — Discharge Instructions (Signed)
Diet: As you were doing prior to hospitalization   Shower:  May shower but keep the wounds dry, use an occlusive plastic wrap, NO SOAKING IN TUB.  If the bandage gets wet, change with a clean dry gauze.  Dressing:  Remain in splint until follow-up.  Can loosen dressing if needed.  Activity:  Increase activity slowly as tolerated, but follow the weight bearing instructions below.  No lifting or driving for 6 weeks.  Weight Bearing:   Non-weightbearing to the left leg.  To prevent constipation: you may use a stool softener such as -  Colace (over the counter) 100 mg by mouth twice a day  Drink plenty of fluids (prune juice may be helpful) and high fiber foods Miralax (over the counter) for constipation as needed.    Itching:  If you experience itching with your medications, try taking only a single pain pill, or even half a pain pill at a time.  You may take up to 10 pain pills per day, and you can also use benadryl over the counter for itching or also to help with sleep.   Precautions:  If you experience chest pain or shortness of breath - call 911 immediately for transfer to the hospital emergency department!!  If you develop a fever greater that 101 F, purulent drainage from wound, increased redness or drainage from wound, or calf pain-Call [facility]                                              Follow- Up Appointment:  Please call for an appointment to be seen in 2 weeks at [facility]

## 2018-12-27 LAB — NASOPHARYNGEAL CULTURE: Culture: NORMAL

## 2018-12-29 ENCOUNTER — Emergency Department
Admission: EM | Admit: 2018-12-29 | Discharge: 2018-12-29 | Disposition: A | Discharge status: Home / Self Care | Payer: Managed Care, Other (non HMO) | Attending: Emergency Medicine | Admitting: Emergency Medicine

## 2018-12-29 ENCOUNTER — Encounter: Payer: Self-pay | Admitting: Emergency Medicine

## 2018-12-29 ENCOUNTER — Other Ambulatory Visit: Payer: Self-pay

## 2018-12-29 DIAGNOSIS — S91012D Laceration without foreign body, left ankle, subsequent encounter: Secondary | ICD-10-CM | POA: Insufficient documentation

## 2018-12-29 DIAGNOSIS — F17210 Nicotine dependence, cigarettes, uncomplicated: Secondary | ICD-10-CM | POA: Insufficient documentation

## 2018-12-29 DIAGNOSIS — Z4802 Encounter for removal of sutures: Secondary | ICD-10-CM | POA: Insufficient documentation

## 2018-12-29 DIAGNOSIS — Z79899 Other long term (current) drug therapy: Secondary | ICD-10-CM | POA: Insufficient documentation

## 2018-12-29 DIAGNOSIS — X58XXXD Exposure to other specified factors, subsequent encounter: Secondary | ICD-10-CM | POA: Insufficient documentation

## 2018-12-29 NOTE — ED Triage Notes (Signed)
Pt here for staple removal. No s/s of infection

## 2018-12-29 NOTE — ED Notes (Signed)

## 2018-12-29 NOTE — ED Provider Notes (Signed)
[facility] Emergency Department Provider Note  ____________________________________________  Time seen: Approximately 2:03 PM  I have reviewed the triage vital signs and the nursing notes.   HISTORY  Chief Complaint Suture / Staple Removal    HPI Diane Mayer is a 18 y.o. female presents emergency department for staple removal.  No concerns or questions.   Past Medical History:  Diagnosis Date  . ZOXWRUEA(540.9Headache(784.0)     Patient Active Problem List   Diagnosis Date Noted  . Ankle fracture, left 12/25/2018  . Episodic tension type headache 02/07/2014  . Migraine without aura, without mention of intractable migraine without mention of status migrainosus 02/07/2014    Past Surgical History:  Procedure Laterality Date  . ORIF ANKLE FRACTURE Left 12/25/2018   Procedure: OPEN REDUCTION INTERNAL FIXATION (ORIF) LEFT ANKLE FRACTURE;  Surgeon: Christena FlakePoggi, John J, MD;  Location: [facility] ORS;  Service: Orthopedics;  Laterality: Left;    Prior to Admission medications   Medication Sig Start Date End Date Taking? Authorizing Provider  norethindrone-ethinyl estradiol-iron (LOESTRIN FE) 1.5-30 MG-MCG tablet Take 1 tablet by mouth daily.    [provider]  oxyCODONE (ROXICODONE) 5 MG immediate release tablet Take 1-2 tablets (5-10 mg total) by mouth every 6 (six) hours as needed for moderate pain. 12/25/18   Poggi, Excell SeltzerJohn J, MD    Allergies Patient has no known allergies.  Family History  Problem Relation Age of Onset  . Migraines Mother     Social History Social History   Tobacco Use  . Smoking status: Current Some Day Smoker  . Smokeless tobacco: Never Used  Substance Use Topics  . Alcohol use: Yes  . Drug use: No     Review of Systems  Constitutional: No fever/chills Cardiovascular: No chest pain. Respiratory: No SOB. Gastrointestinal: No nausea, no vomiting.  Musculoskeletal: Negative for musculoskeletal pain. Skin: Negative for  rash,  ecchymosis.  Positive for stapled laceration.   ____________________________________________   PHYSICAL EXAM:  VITAL SIGNS: ED Triage Vitals  Enc Vitals Group     BP 12/29/18 1258 134/76     Pulse Rate 12/29/18 1258 (!) 117     Resp 12/29/18 1258 18     Temp 12/29/18 1258 98.3 F (36.8 C)     Temp Source 12/29/18 1258 Oral     SpO2 12/29/18 1258 99 %     Weight --      Height --      Head Circumference --      Peak Flow --      Pain Score 12/29/18 1302 0     Pain Loc --      Pain Edu? --      Excl. in GC? --      Constitutional: Alert and oriented. Well appearing and in no acute distress. Eyes: Conjunctivae are normal. PERRL. EOMI. Head: Laceration to posterior head with 8 staples in place. ENT:      Ears:      Nose: No congestion/rhinnorhea.      Mouth/Throat: Mucous membranes are moist.  Neck: No stridor. Cardiovascular: Normal rate, regular rhythm.  Good peripheral circulation. Respiratory: Normal respiratory effort without tachypnea or retractions. Lungs CTAB. Good air entry to the bases with no decreased or absent breath sounds. Musculoskeletal: Full range of motion to all extremities. No gross deformities appreciated.  Cast in place to left ankle. Neurologic:  Normal speech and language. No gross focal neurologic deficits are appreciated.  Skin:  Skin is warm, dry and intact. No  rash noted. Psychiatric: Mood and affect are normal. Speech and behavior are normal. Patient exhibits appropriate insight and judgement.   ____________________________________________   LABS (all labs ordered are listed, but only abnormal results are displayed)  Labs Reviewed - No data to display ____________________________________________  EKG   ____________________________________________  RADIOLOGY  No results found.  ____________________________________________    PROCEDURES  Procedure(s) performed:    Procedures  SUTURE REMOVAL Performed by: Enid Derry  Consent: Verbal consent obtained. Patient identity confirmed: provided demographic data Time out: Immediately prior to procedure a "time out" was called to verify the correct patient, procedure, equipment, support staff and site/side marked as required.  Location details: scalp  Wound Appearance: clean  Sutures/Staples Removed: 8  Facility: sutures placed in this facility Patient tolerance: Patient tolerated the procedure well with no immediate complications.    Medications - No data to display   ____________________________________________   INITIAL IMPRESSION / ASSESSMENT AND PLAN / ED COURSE  Pertinent labs & imaging results that were available during my care of the patient were reviewed by me and considered in my medical decision making (see chart for details).  Review of the [place] CSRS was performed in accordance of the [facility] prior to dispensing any controlled drugs.     Patient's diagnosis is consistent with staple removal.  Staples were removed in the emergency department.  Patient is to follow up with primary care as directed.  She has a follow-up with Ortho on May 13 for her broken ankle.  Patient is given ED precautions to return to the ED for any worsening or new symptoms.     ____________________________________________  FINAL CLINICAL IMPRESSION(S) / ED DIAGNOSES  Final diagnoses:  Encounter for staple removal      NEW MEDICATIONS STARTED DURING THIS VISIT:  ED Discharge Orders    None          This chart was dictated using voice recognition software/Dragon. Despite best efforts to proofread, errors can occur which can change the meaning. Any change was purely unintentional.    Enid Derry, PA-C 12/29/18 1439    Arnaldo Natal, MD 12/29/18 [phone]

## 2020-05-19 ENCOUNTER — Other Ambulatory Visit: Payer: Managed Care, Other (non HMO)

## 2020-05-19 ENCOUNTER — Other Ambulatory Visit: Payer: Self-pay

## 2020-05-19 DIAGNOSIS — Z20822 Contact with and (suspected) exposure to covid-19: Secondary | ICD-10-CM

## 2020-05-20 LAB — NOVEL CORONAVIRUS, NAA: SARS-CoV-2, NAA: NOT DETECTED

## 2020-05-20 LAB — SARS-COV-2, NAA 2 DAY TAT

## 2020-09-08 IMAGING — RF DG C-ARM 61-120 MIN
1 series · 3 of 3 positions shown · non-contrast
Comparison: None

FLUOROSCOPY TIME:  21 seconds

CLINICAL DATA: Status post ankle fracture repair

EXAM:
DG C-ARM 61-120 MIN; LEFT ANKLE COMPLETE - 3+ VIEW

[Series 1: run · 3 of 3 slices shown]
[im 1/3]
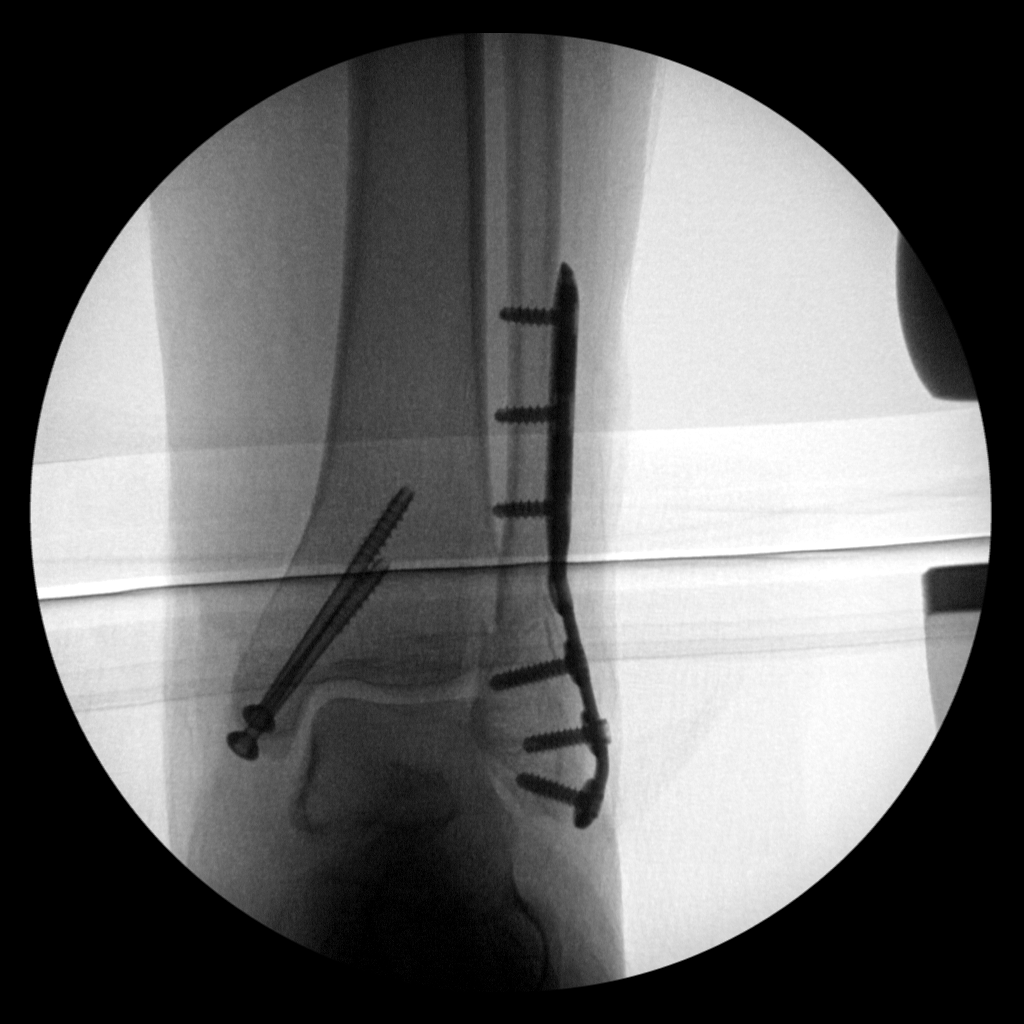
[im 2/3]
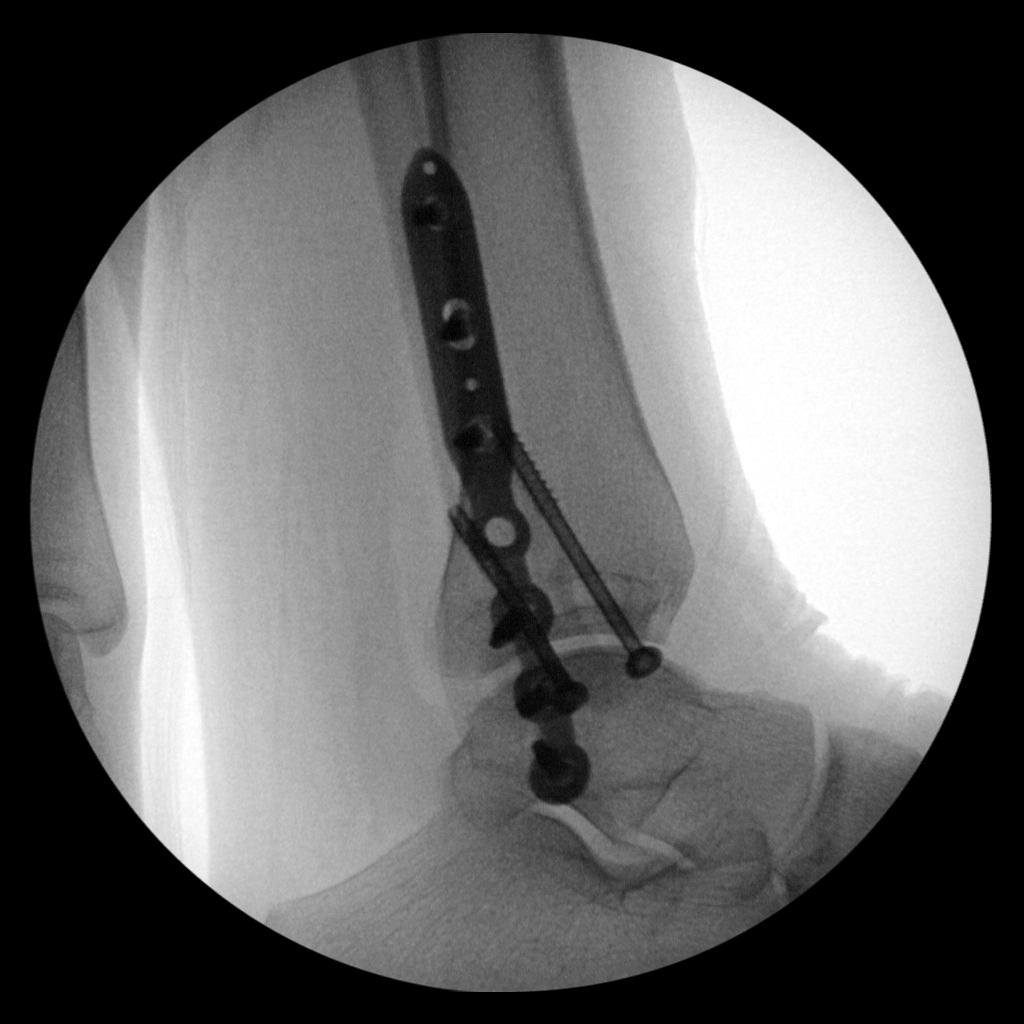
[im 3/3]
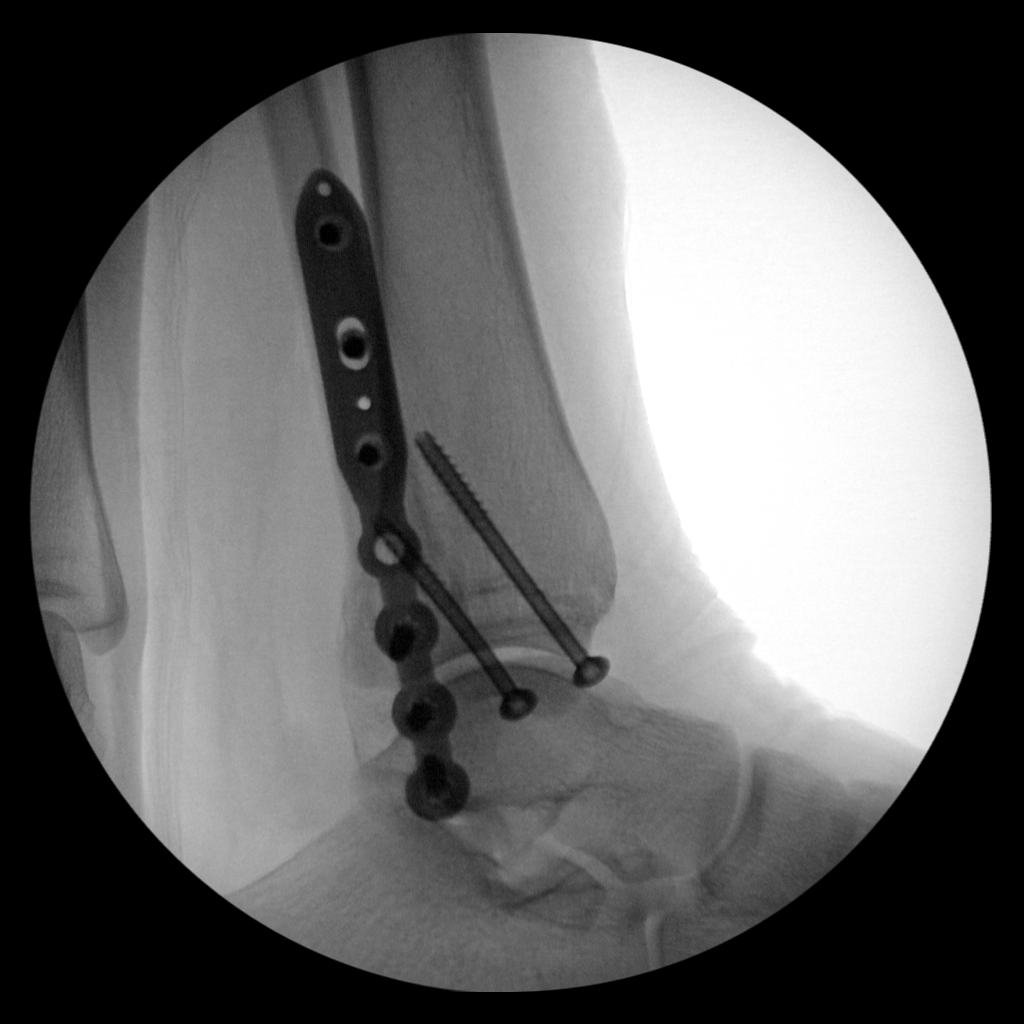

[3 of 3 positions shown; findings below may reference images not displayed]

FINDINGS: Distal fibular metaphysis fracture transfixed with a lateral
sideplate and multiple interlocking screws in near anatomic
alignment.

Medial malleolar fracture transfixed with 2 cannulated
transmalleolar screws in anatomic alignment.

No other fracture or dislocation.
IMPRESSION: Interval ORIF of medial and lateral malleolar fractures.

## 2021-04-06 ENCOUNTER — Encounter: Payer: Managed Care, Other (non HMO) | Admitting: Obstetrics
